# Patient Record
Sex: Female | Born: 1980 | Hispanic: No | Marital: Married
Health system: Southern US, Community
[De-identification: ages and names within clinical notes are randomized; demographics above are authoritative.]

## PROBLEM LIST (undated history)

## (undated) DIAGNOSIS — Z789 Other specified health status: Secondary | ICD-10-CM

## (undated) HISTORY — PX: NO PAST SURGERIES: SHX2092

---

## 2003-01-14 ENCOUNTER — Emergency Department (HOSPITAL_COMMUNITY): Admission: EM | Admit: 2003-01-14 | Discharge: 2003-01-14 | Payer: Self-pay | Admitting: Emergency Medicine

## 2003-06-11 ENCOUNTER — Emergency Department (HOSPITAL_COMMUNITY): Admission: EM | Admit: 2003-06-11 | Discharge: 2003-06-11 | Payer: Self-pay | Admitting: Emergency Medicine

## 2004-03-26 ENCOUNTER — Inpatient Hospital Stay (HOSPITAL_COMMUNITY): Admission: AD | Admit: 2004-03-26 | Discharge: 2004-03-28 | Payer: Self-pay | Admitting: Obstetrics & Gynecology

## 2004-04-19 ENCOUNTER — Inpatient Hospital Stay (HOSPITAL_COMMUNITY): Admission: AD | Admit: 2004-04-19 | Discharge: 2004-04-19 | Payer: Self-pay | Admitting: Obstetrics

## 2006-09-30 ENCOUNTER — Emergency Department (HOSPITAL_COMMUNITY): Admission: EM | Admit: 2006-09-30 | Discharge: 2006-09-30 | Payer: Self-pay | Admitting: *Deleted

## 2007-03-26 ENCOUNTER — Ambulatory Visit (HOSPITAL_COMMUNITY): Admission: RE | Admit: 2007-03-26 | Discharge: 2007-03-26 | Payer: Self-pay | Admitting: Obstetrics and Gynecology

## 2007-08-11 ENCOUNTER — Inpatient Hospital Stay (HOSPITAL_COMMUNITY): Admission: AD | Admit: 2007-08-11 | Discharge: 2007-08-12 | Payer: Self-pay | Admitting: Obstetrics & Gynecology

## 2007-08-11 ENCOUNTER — Ambulatory Visit: Payer: Self-pay | Admitting: Obstetrics and Gynecology

## 2010-08-28 NOTE — L&D Delivery Note (Signed)
Delivery Note At 11:58 AM a viable female was delivered via Vaginal, Spontaneous Delivery (Presentation: Right Occiput Anterior).  APGAR: 9, 9; weight 7 lb 2.3 oz (3240 g).   Placenta status: Intact, Spontaneous.  Cord: 3 vessels with the following complications: None.   Anesthesia: Epidural  Lacerations: 2nd degree;Perineal Suture Repair: 3.0 vicryl Est. Blood Loss (mL): 300  Mom to postpartum.  Baby to nursery-stable.  Melvena Vink JEHIEL 05/24/2011, 12:30 PM

## 2010-12-07 ENCOUNTER — Other Ambulatory Visit: Payer: Self-pay | Admitting: Family Medicine

## 2010-12-07 DIAGNOSIS — Z3689 Encounter for other specified antenatal screening: Secondary | ICD-10-CM

## 2010-12-07 LAB — ANTIBODY SCREEN: Antibody Screen: NEGATIVE

## 2010-12-07 LAB — ABO/RH

## 2010-12-07 LAB — HIV ANTIBODY (ROUTINE TESTING W REFLEX): HIV: NONREACTIVE

## 2010-12-07 LAB — RUBELLA ANTIBODY, IGM: Rubella: IMMUNE

## 2010-12-22 ENCOUNTER — Ambulatory Visit (HOSPITAL_COMMUNITY)
Admission: RE | Admit: 2010-12-22 | Discharge: 2010-12-22 | Disposition: A | Discharge status: Home / Self Care | Payer: Medicaid Other | Source: Ambulatory Visit | Attending: Family Medicine | Admitting: Family Medicine

## 2010-12-22 DIAGNOSIS — Z3689 Encounter for other specified antenatal screening: Secondary | ICD-10-CM

## 2010-12-22 DIAGNOSIS — Z363 Encounter for antenatal screening for malformations: Secondary | ICD-10-CM | POA: Insufficient documentation

## 2010-12-22 DIAGNOSIS — O358XX0 Maternal care for other (suspected) fetal abnormality and damage, not applicable or unspecified: Secondary | ICD-10-CM | POA: Insufficient documentation

## 2010-12-22 DIAGNOSIS — Z1389 Encounter for screening for other disorder: Secondary | ICD-10-CM | POA: Insufficient documentation

## 2010-12-27 ENCOUNTER — Ambulatory Visit (HOSPITAL_COMMUNITY)
Admission: RE | Admit: 2010-12-27 | Discharge: 2010-12-27 | Disposition: A | Discharge status: Home / Self Care | Payer: Self-pay | Source: Ambulatory Visit | Attending: Family Medicine | Admitting: Family Medicine

## 2010-12-28 ENCOUNTER — Ambulatory Visit (HOSPITAL_COMMUNITY): Payer: Self-pay

## 2010-12-29 ENCOUNTER — Other Ambulatory Visit: Payer: Self-pay | Admitting: Family Medicine

## 2010-12-29 DIAGNOSIS — Z3689 Encounter for other specified antenatal screening: Secondary | ICD-10-CM

## 2011-01-05 ENCOUNTER — Encounter (HOSPITAL_COMMUNITY): Payer: Self-pay

## 2011-01-05 ENCOUNTER — Ambulatory Visit (HOSPITAL_COMMUNITY)
Admission: RE | Admit: 2011-01-05 | Discharge: 2011-01-05 | Disposition: A | Discharge status: Home / Self Care | Payer: Medicaid Other | Source: Ambulatory Visit | Attending: Family Medicine | Admitting: Family Medicine

## 2011-01-05 DIAGNOSIS — Z3689 Encounter for other specified antenatal screening: Secondary | ICD-10-CM | POA: Insufficient documentation

## 2011-05-05 LAB — GC/CHLAMYDIA PROBE AMP, GENITAL: Chlamydia: NEGATIVE

## 2011-05-24 ENCOUNTER — Inpatient Hospital Stay (HOSPITAL_COMMUNITY): Payer: Medicaid Other | Admitting: Anesthesiology

## 2011-05-24 ENCOUNTER — Encounter (HOSPITAL_COMMUNITY): Payer: Self-pay | Admitting: *Deleted

## 2011-05-24 ENCOUNTER — Encounter (HOSPITAL_COMMUNITY): Payer: Self-pay | Admitting: Anesthesiology

## 2011-05-24 ENCOUNTER — Inpatient Hospital Stay (HOSPITAL_COMMUNITY)
Admission: AD | Admit: 2011-05-24 | Discharge: 2011-05-26 | DRG: 775 | Disposition: A | Discharge status: Home / Self Care | Payer: Medicaid Other | Source: Ambulatory Visit | Attending: Family Medicine | Admitting: Family Medicine

## 2011-05-24 DIAGNOSIS — R05 Cough: Secondary | ICD-10-CM

## 2011-05-24 HISTORY — DX: Other specified health status: Z78.9

## 2011-05-24 LAB — CBC
MCV: 80.9 fL (ref 78.0–100.0)
RBC: 4.18 MIL/uL (ref 3.87–5.11)
RDW: 14.5 % (ref 11.5–15.5)

## 2011-05-24 LAB — RPR: RPR Ser Ql: NONREACTIVE

## 2011-05-24 MED ORDER — LACTATED RINGERS IV SOLN: 500.0000 mL | INTRAVENOUS | Status: DC | PRN

## 2011-05-24 MED ORDER — OXYTOCIN 20 UNITS IN LACTATED RINGERS INFUSION - SIMPLE
125.0000 mL/h | Freq: Once | INTRAVENOUS | Status: AC
  Administered 2011-05-24: 125 mL/h via INTRAVENOUS

## 2011-05-24 MED ORDER — ZOLPIDEM TARTRATE 5 MG PO TABS: 5.0000 mg | ORAL_TABLET | Freq: Every evening | ORAL | Status: DC | PRN

## 2011-05-24 MED ORDER — EPHEDRINE 5 MG/ML INJ
10.0000 mg | INTRAVENOUS | Status: DC | PRN
  Filled 2011-05-24: qty 4

## 2011-05-24 MED ORDER — ONDANSETRON HCL 4 MG PO TABS: 4.0000 mg | ORAL_TABLET | ORAL | Status: DC | PRN

## 2011-05-24 MED ORDER — IBUPROFEN 600 MG PO TABS: 600.0000 mg | ORAL_TABLET | Freq: Four times a day (QID) | ORAL | Status: DC | PRN

## 2011-05-24 MED ORDER — ACETAMINOPHEN 325 MG PO TABS: 650.0000 mg | ORAL_TABLET | ORAL | Status: DC | PRN

## 2011-05-24 MED ORDER — SENNOSIDES-DOCUSATE SODIUM 8.6-50 MG PO TABS
2.0000 | ORAL_TABLET | Freq: Every day | ORAL | Status: DC
  Administered 2011-05-24 – 2011-05-25 (×2): 2 via ORAL

## 2011-05-24 MED ORDER — OXYCODONE-ACETAMINOPHEN 5-325 MG PO TABS: 2.0000 | ORAL_TABLET | ORAL | Status: DC | PRN

## 2011-05-24 MED ORDER — DIPHENHYDRAMINE HCL 25 MG PO CAPS: 25.0000 mg | ORAL_CAPSULE | Freq: Four times a day (QID) | ORAL | Status: DC | PRN

## 2011-05-24 MED ORDER — WITCH HAZEL-GLYCERIN EX PADS: 1.0000 "application " | MEDICATED_PAD | CUTANEOUS | Status: DC | PRN

## 2011-05-24 MED ORDER — LANOLIN HYDROUS EX OINT: TOPICAL_OINTMENT | CUTANEOUS | Status: DC | PRN

## 2011-05-24 MED ORDER — DIBUCAINE 1 % RE OINT: 1.0000 "application " | TOPICAL_OINTMENT | RECTAL | Status: DC | PRN

## 2011-05-24 MED ORDER — ONDANSETRON HCL 4 MG/2ML IJ SOLN: 4.0000 mg | Freq: Four times a day (QID) | INTRAMUSCULAR | Status: DC | PRN

## 2011-05-24 MED ORDER — LACTATED RINGERS IV SOLN: 500.0000 mL | Freq: Once | INTRAVENOUS | Status: DC

## 2011-05-24 MED ORDER — FLEET ENEMA 7-19 GM/118ML RE ENEM: 1.0000 | ENEMA | RECTAL | Status: DC | PRN

## 2011-05-24 MED ORDER — FENTANYL 2.5 MCG/ML BUPIVACAINE 1/10 % EPIDURAL INFUSION (WH - ANES)
14.0000 mL/h | INTRAMUSCULAR | Status: DC
  Administered 2011-05-24: 14 mL/h via EPIDURAL
  Filled 2011-05-24: qty 60

## 2011-05-24 MED ORDER — DIPHENHYDRAMINE HCL 50 MG/ML IJ SOLN: 12.5000 mg | INTRAMUSCULAR | Status: DC | PRN

## 2011-05-24 MED ORDER — ONDANSETRON HCL 4 MG/2ML IJ SOLN: 4.0000 mg | INTRAMUSCULAR | Status: DC | PRN

## 2011-05-24 MED ORDER — PHENYLEPHRINE 40 MCG/ML (10ML) SYRINGE FOR IV PUSH (FOR BLOOD PRESSURE SUPPORT)
80.0000 ug | PREFILLED_SYRINGE | INTRAVENOUS | Status: DC | PRN
  Filled 2011-05-24: qty 5

## 2011-05-24 MED ORDER — TETANUS-DIPHTH-ACELL PERTUSSIS 5-2.5-18.5 LF-MCG/0.5 IM SUSP
0.5000 mL | Freq: Once | INTRAMUSCULAR | Status: AC
  Administered 2011-05-25: 0.5 mL via INTRAMUSCULAR
  Filled 2011-05-24: qty 0.5

## 2011-05-24 MED ORDER — GUAIFENESIN-DM 100-10 MG/5ML PO SYRP
10.0000 mL | ORAL_SOLUTION | ORAL | Status: DC | PRN
  Administered 2011-05-24: 10 mL via ORAL
  Filled 2011-05-24: qty 10

## 2011-05-24 MED ORDER — OXYTOCIN BOLUS FROM INFUSION
500.0000 mL | Freq: Once | INTRAVENOUS | Status: DC
  Filled 2011-05-24: qty 500
  Filled 2011-05-24: qty 1000

## 2011-05-24 MED ORDER — PRENATAL PLUS 27-1 MG PO TABS
1.0000 | ORAL_TABLET | Freq: Every day | ORAL | Status: DC
  Administered 2011-05-25 – 2011-05-26 (×2): 1 via ORAL
  Filled 2011-05-24 (×2): qty 1

## 2011-05-24 MED ORDER — CITRIC ACID-SODIUM CITRATE 334-500 MG/5ML PO SOLN: 30.0000 mL | ORAL | Status: DC | PRN

## 2011-05-24 MED ORDER — PHENYLEPHRINE 40 MCG/ML (10ML) SYRINGE FOR IV PUSH (FOR BLOOD PRESSURE SUPPORT)
PREFILLED_SYRINGE | INTRAVENOUS | Status: AC
  Filled 2011-05-24: qty 5

## 2011-05-24 MED ORDER — LIDOCAINE HCL (PF) 1 % IJ SOLN
30.0000 mL | INTRAMUSCULAR | Status: DC | PRN
  Filled 2011-05-24 (×2): qty 30

## 2011-05-24 MED ORDER — BENZOCAINE-MENTHOL 20-0.5 % EX AERO: 1.0000 "application " | INHALATION_SPRAY | CUTANEOUS | Status: DC | PRN

## 2011-05-24 MED ORDER — SIMETHICONE 80 MG PO CHEW: 80.0000 mg | CHEWABLE_TABLET | ORAL | Status: DC | PRN

## 2011-05-24 MED ORDER — IBUPROFEN 600 MG PO TABS
600.0000 mg | ORAL_TABLET | Freq: Four times a day (QID) | ORAL | Status: DC
  Administered 2011-05-24 – 2011-05-26 (×8): 600 mg via ORAL
  Filled 2011-05-24 (×8): qty 1

## 2011-05-24 MED ORDER — FENTANYL 2.5 MCG/ML BUPIVACAINE 1/10 % EPIDURAL INFUSION (WH - ANES)
INTRAMUSCULAR | Status: DC | PRN
  Administered 2011-05-24: 13 mL/h via EPIDURAL

## 2011-05-24 MED ORDER — LIDOCAINE HCL 1.5 % IJ SOLN
INTRAMUSCULAR | Status: DC | PRN
  Administered 2011-05-24 (×2): 5 mL via EPIDURAL

## 2011-05-24 MED ORDER — FENTANYL 2.5 MCG/ML BUPIVACAINE 1/10 % EPIDURAL INFUSION (WH - ANES)
INTRAMUSCULAR | Status: AC
  Filled 2011-05-24: qty 60

## 2011-05-24 MED ORDER — OXYCODONE-ACETAMINOPHEN 5-325 MG PO TABS
1.0000 | ORAL_TABLET | ORAL | Status: DC | PRN
  Administered 2011-05-25: 1 via ORAL
  Filled 2011-05-24: qty 1

## 2011-05-24 MED ORDER — LACTATED RINGERS IV SOLN
INTRAVENOUS | Status: DC
  Administered 2011-05-24: 10:00:00 via INTRAVENOUS

## 2011-05-24 NOTE — Anesthesia Postprocedure Evaluation (Signed)
Anesthesia Post Note  Patient: Cheyenne Mora  Procedure(s) Performed: * No procedures listed *  Anesthesia type: Epidural  Patient location: Mother/Baby  Post pain: Pain level controlled  Post assessment: Post-op Vital signs reviewed  Last Vitals:  Filed Vitals:   05/24/11 1317  BP: 100/60  Pulse: 78  Temp:   Resp: 20    Post vital signs: Reviewed  Level of consciousness: awake  Complications: No apparent anesthesia complications

## 2011-05-24 NOTE — H&P (Signed)
Cheyenne Mora is a 30 y.o. female presenting for spontaneous onset of labor. Maternal Medical History:  Reason for admission: Reason for admission: contractions.  Reason for Admission:   nauseaContractions: Onset was 3-5 hours ago.   Frequency: regular.   Duration is approximately 1 minute.   Perceived severity is moderate.    Fetal activity: Perceived fetal activity is normal.   Last perceived fetal movement was within the past hour.    Prenatal complications: No bleeding, cholelithiasis, HIV, hypertension, infection, IUGR, pre-eclampsia or substance abuse.   Prenatal Complications - Diabetes: none.    OB History    Grav Para Term Preterm Abortions TAB SAB Ect Mult Living   3 2 2  0 0 0 0 0 0 2     Past Medical History  Diagnosis Date  . No pertinent past medical history    Past Surgical History  Procedure Date  . No past surgeries    Family History: family history is not on file. Social History:  reports that she has never smoked. She has never used smokeless tobacco. She reports that she does not drink alcohol or use illicit drugs.  Review of Systems  Constitutional: Negative.  Negative for fever, chills and weight loss.  Eyes: Negative for blurred vision and double vision.  Respiratory: Negative for cough.   Cardiovascular: Negative for chest pain, palpitations and leg swelling.  Gastrointestinal: Negative for nausea and vomiting.  Genitourinary: Negative for dysuria, hematuria and flank pain.  Neurological: Negative for dizziness and headaches.    Dilation: 4 Effacement (%): 80 Station: 0 Exam by:: J. Spurlock-Frizzell, RN Blood pressure 112/42, pulse 89, temperature 97.7 F (36.5 C), temperature source Oral, resp. rate 20, height 5\' 6"  (1.676 m), weight 89.359 kg (197 lb). Maternal Exam:  Uterine Assessment: Contraction strength is moderate.  Contraction duration is 1 minute. Contraction frequency is regular.   Abdomen: Patient reports no abdominal  tenderness. Fetal presentation: vertex  Introitus: not evaluated.   Ferning test: not done.  Nitrazine test: not done. Amniotic fluid character: not assessed.  Cervix: not evaluated.   Fetal Exam Fetal Monitor Review: Mode: ultrasound.   Baseline rate: 145.  Variability: moderate (6-25 bpm).   Pattern: early decelerations and accelerations present.    Fetal State Assessment: Category I - tracings are normal.     Physical Exam  Nursing note and vitals reviewed. Constitutional: She is oriented to person, place, and time. She appears well-developed and well-nourished. No distress.  HENT:  Head: Normocephalic and atraumatic.  Cardiovascular: Normal rate, regular rhythm, normal heart sounds and intact distal pulses.  Exam reveals no gallop and no friction rub.   No murmur heard. Respiratory: Effort normal and breath sounds normal. No respiratory distress. She has no wheezes. She has no rales.  GI: Bowel sounds are normal.       Gravid  Musculoskeletal: She exhibits no edema and no tenderness.  Neurological: She is alert and oriented to person, place, and time.  Skin: Skin is warm and dry. She is not diaphoretic.  Psychiatric: She has a normal mood and affect. Her behavior is normal. Judgment and thought content normal.    Prenatal labs: ABO, Rh: A/Positive/-- (04/11 0000) Antibody: Negative (04/11 0000) Rubella: Immune (04/11 0000) RPR: Nonreactive (04/11 0000)  HBsAg: Positive (04/11 0000)  HIV: Non-reactive (04/11 0000)  GBS: Negative (09/07 0000)   Assessment/Plan: Patient is 30 year old female G3P2002 at 42 wk 4 d GA with spontaneous onset of labor.    Janeece Riggers  05/24/2011, 8:54 AM  Patient seen in conjunction with Janeece Riggers, PA-S.  Agree with the note as written above.  Will admit to L&D and provide expectant management.    Candelaria Celeste JEHIEL 05/24/11

## 2011-05-24 NOTE — Anesthesia Procedure Notes (Signed)
Epidural Patient location during procedure: OB Start time: 05/24/2011 8:38 AM End time: 05/24/2011 8:45 AM Reason for block: procedure for pain  Staffing Anesthesiologist: Sandrea Hughs Performed by: anesthesiologist   Preanesthetic Checklist Completed: patient identified, site marked, surgical consent, pre-op evaluation, timeout performed, IV checked, risks and benefits discussed and monitors and equipment checked  Epidural Patient position: sitting Prep: site prepped and draped and DuraPrep Patient monitoring: continuous pulse ox and blood pressure Approach: midline Injection technique: LOR air  Needle:  Needle type: Tuohy  Needle gauge: 17 G Needle length: 9 cm Needle insertion depth: 6 cm Catheter type: closed end flexible Catheter size: 19 Gauge Catheter at skin depth: 11 cm Test dose: negative and 1.5% lidocaine  Assessment Events: blood not aspirated, injection not painful, no injection resistance, negative IV test and no paresthesia

## 2011-05-24 NOTE — Progress Notes (Signed)
Cheyenne Mora is a 30 y.o. G3P2002 at [redacted]w[redacted]d by LMP admitted for active labor  Subjective: Feeling quite a bit of pain with contractions.  Objective: BP 122/57  Pulse 76  Temp(Src) 98.1 F (36.7 C) (Oral)  Resp 20  Ht 5\' 6"  (1.676 m)  Wt 89.359 kg (197 lb)  BMI 31.80 kg/m2      FHT:  FHR: 140s bpm, variability: moderate,  accelerations:  Present,  decelerations:  Absent UC:   regular, every 2-3 minutes SVE:   Dilation: 5.5 Effacement (%): 80 Station: -1 Exam by:: dr. Adrian Blackwater  Labs: Lab Results  Component Value Date   WBC 4.8 05/24/2011   HGB 11.0* 05/24/2011   HCT 33.8* 05/24/2011   MCV 80.9 05/24/2011   PLT 260 05/24/2011    Assessment / Plan: AROM with clear fluid.  Fetal Wellbeing:  Category I Pain Control:  Epidural I/D:  n/a Anticipated MOD:  NSVD  STINSON, JACOB JEHIEL 05/24/2011, 11:36 AM

## 2011-05-24 NOTE — Anesthesia Preprocedure Evaluation (Signed)
Anesthesia Evaluation  Name, MR# and DOB Patient awake  General Assessment Comment  Reviewed: Allergy & Precautions, H&P , NPO status , Patient's Chart, lab work & pertinent test results  Airway Mallampati: II TM Distance: >3 FB Neck ROM: full    Dental No notable dental hx.    Pulmonary  clear to auscultation  pulmonary exam normalPulmonary Exam Normal breath sounds clear to auscultation none    Cardiovascular     Neuro/Psych Negative Neurological ROS  Negative Psych ROS  GI/Hepatic/Renal negative GI ROS  negative Liver ROS  negative Renal ROS        Endo/Other  Negative Endocrine ROS (+)      Abdominal Normal abdominal exam  (+)   Musculoskeletal negative musculoskeletal ROS (+)   Hematology negative hematology ROS (+)   Peds  Reproductive/Obstetrics (+) Pregnancy    Anesthesia Other Findings             Anesthesia Physical Anesthesia Plan  ASA: II  Anesthesia Plan: Epidural   Post-op Pain Management:    Induction:   Airway Management Planned:   Additional Equipment:   Intra-op Plan:   Post-operative Plan:   Informed Consent: I have reviewed the patients History and Physical, chart, labs and discussed the procedure including the risks, benefits and alternatives for the proposed anesthesia with the patient or authorized representative who has indicated his/her understanding and acceptance.     Plan Discussed with:   Anesthesia Plan Comments:         Anesthesia Quick Evaluation  

## 2011-05-24 NOTE — Progress Notes (Signed)
05/24/2011 Cheyenne Mora  Interpreter  Assisted  Christy  RN with questions

## 2011-05-24 NOTE — Progress Notes (Signed)
Cheyenne Mora is a 30 y.o. G3P2002 at [redacted]w[redacted]d by LMP admitted for active labor  Subjective: Has epidural - still feels contractions a little.  No pressure.  Objective: BP 109/59  Pulse 77  Temp(Src) 98.1 F (36.7 C) (Oral)  Resp 18  Ht 5\' 6"  (1.676 m)  Wt 89.359 kg (197 lb)  BMI 31.80 kg/m2      FHT:  FHR: 140s bpm, variability: moderate,  accelerations:  Present,  decelerations:  Absent UC:   regular, every 3-4 minutes SVE:   Dilation: 5 Effacement (%): 80 Station: -2 Exam by:: felkel,rn  Labs: Lab Results  Component Value Date   WBC 4.8 05/24/2011   HGB 11.0* 05/24/2011   HCT 33.8* 05/24/2011   MCV 80.9 05/24/2011   PLT 260 05/24/2011    Assessment / Plan: Spontaneous labor, progressing normally  Labor: Progressing normally Preeclampsia:  n/a Pain Control:  Epidural I/D:  n/a Anticipated MOD:  NSVD  STINSON, JACOB JEHIEL 05/24/2011, 10:45 AM

## 2011-05-24 NOTE — Progress Notes (Signed)
Cheyenne Mora in for triage. Pt states contr.ctions are every 5-6 minutes. Was 1 cm last week.

## 2011-05-25 NOTE — Progress Notes (Signed)
Post Partum Day 1 Subjective: no complaints, voiding, tolerating PO, + flatus and no BM  Objective: Blood pressure 90/60, pulse 71, temperature 98.1 F (36.7 C), temperature source Oral, resp. rate 16, height 5\' 6"  (1.676 m), weight 89.359 kg (197 lb), unknown if currently breastfeeding.  Physical Exam:  General: alert, cooperative and no distress Lochia: appropriate Uterine Fundus: firm DVT Evaluation: No evidence of DVT seen on physical exam. Negative Homan's sign. No cords or calf tenderness. No significant calf/ankle edema.   Basename 05/24/11 0730  HGB 11.0*  HCT 33.8*  CBG (last 3)  No results found for this basename: GLUCAP:3 in the last 72 hours    Assessment/Plan: Plan for discharge tomorrow and Contraception planning IUD  Interpreter was present for this visit.   LOS: 1 day   SMITH,JOSHUA 05/25/2011, 8:36 AM    Pt hx reviewed; pt seen and examined and agree with note above. [facility]

## 2011-05-25 NOTE — Progress Notes (Signed)
UR chart review completed.  

## 2011-05-25 NOTE — Anesthesia Postprocedure Evaluation (Signed)
  Anesthesia Post-op Note  Patient: Cheyenne Mora  Level of Consciousness: awake, alert  and oriented  Airway and Oxygen Therapy: Patient Spontanous Breathing  Post-op Pain: mild  Post-op Assessment: Post-op Vital signs reviewed, No headache, No backache, No residual numbness and No residual motor weakness  Post-op Vital Signs: Reviewed and stable  Complications: No apparent anesthesia complications

## 2011-05-25 NOTE — Progress Notes (Signed)
Encounter addended by: Karleen Dolphin on: 05/25/2011  9:04 AM<BR>     Documentation filed: Notes Section, Charges VN

## 2011-05-26 ENCOUNTER — Other Ambulatory Visit: Payer: Self-pay | Admitting: Obstetrics & Gynecology

## 2011-05-26 MED ORDER — DOCUSATE SODIUM 100 MG PO CAPS: 100.0000 mg | ORAL_CAPSULE | Freq: Two times a day (BID) | ORAL | Status: DC | PRN

## 2011-05-26 MED ORDER — IBUPROFEN 600 MG PO TABS: 600.0000 mg | ORAL_TABLET | Freq: Four times a day (QID) | ORAL | Status: DC

## 2011-05-26 MED ORDER — DOCUSATE SODIUM 100 MG PO CAPS: 100.0000 mg | ORAL_CAPSULE | Freq: Two times a day (BID) | ORAL | Status: AC | PRN

## 2011-05-26 MED ORDER — FERROUS SULFATE 325 (65 FE) MG PO TABS: 325.0000 mg | ORAL_TABLET | Freq: Three times a day (TID) | ORAL | Status: DC

## 2011-05-26 MED ORDER — IBUPROFEN 600 MG PO TABS: 600.0000 mg | ORAL_TABLET | Freq: Four times a day (QID) | ORAL | Status: AC

## 2011-05-26 MED ORDER — FERROUS SULFATE 325 (65 FE) MG PO TABS: 325.0000 mg | ORAL_TABLET | Freq: Three times a day (TID) | ORAL | Status: AC

## 2011-05-26 NOTE — Progress Notes (Signed)
05/26/2011 Cheyenne Mora  Interpreter  I assisted [facility] with discharge plan.

## 2011-05-26 NOTE — Discharge Summary (Signed)
Obstetric Discharge Summary Reason for Admission: onset of labor Prenatal Procedures: ultrasound Intrapartum Procedures: spontaneous vaginal delivery Postpartum Procedures: none Complications-Operative and Postpartum: 2nd degree perineal laceration Hemoglobin  Date Value Range Status  05/24/2011 11.0* 12.0-15.0 (g/dL) Final     HCT  Date Value Range Status  05/24/2011 33.8* 36.0-46.0 (%) Final    Discharge Diagnoses: Term Pregnancy-delivered  Discharge Information: Date: 05/26/2011 Activity: pelvic rest Diet: routine Medications: PNV, Ibuprofen, Colace and Iron Condition: stable Instructions: refer to practice specific booklet Discharge to: home Follow-up Information    Follow up with [facility] [place]. Call today. (for 4-6 week PP visit)    Contact information:   [address] [facility] [address]          Newborn Data: Live born female  Birth Weight: 7 lb 2.3 oz (3240 g) APGAR: 9, 9  Baby may stay secondary to cardiac murmur evaluation.  BOOTH, ERIN 05/26/2011, 7:58 AM

## 2011-05-26 NOTE — Progress Notes (Signed)
Post Partum Day 2 Subjective: no complaints, up ad lib, voiding, tolerating PO, + flatus and no BM yet.  Some anxiety over baby as baby has a heart murmur.  Br/Bo feeding.  IUD for contraception  Objective: Blood pressure 104/62, pulse 75, temperature 97.8 F (36.6 C), temperature source Oral, resp. rate 18, height 5\' 6"  (1.676 m), weight 197 lb (89.359 kg), unknown if currently breastfeeding.  Physical Exam:  General: alert, cooperative, appears stated age and no distress Lochia: appropriate Uterine Fundus: firm DVT Evaluation: No evidence of DVT seen on physical exam. No cords or calf tenderness. No significant calf/ankle edema.   Basename 05/24/11 0730  HGB 11.0*  HCT 33.8*    Assessment/Plan: Discharge home and Contraception IUD at [facility] visit Baby may be staying due to cardiac murmur.   LOS: 2 days   BOOTH, Jennavecia Schwier 05/26/2011, 7:54 AM

## 2011-06-02 LAB — CBC
HCT: 27.6 — ABNORMAL LOW
Hemoglobin: 9.2 — ABNORMAL LOW
RBC: 3.43 — ABNORMAL LOW
WBC: 7.2

## 2011-06-05 LAB — CBC
HCT: 33.5 — ABNORMAL LOW
Hemoglobin: 11.3 — ABNORMAL LOW
MCHC: 33.8
RBC: 4.2
RDW: 14.1

## 2014-06-29 ENCOUNTER — Encounter (HOSPITAL_COMMUNITY): Payer: Self-pay | Admitting: *Deleted

## 2016-06-21 ENCOUNTER — Encounter (HOSPITAL_COMMUNITY): Payer: Self-pay

## 2018-06-19 ENCOUNTER — Other Ambulatory Visit: Payer: Self-pay

## 2018-06-19 ENCOUNTER — Encounter (HOSPITAL_COMMUNITY): Payer: Self-pay | Admitting: Emergency Medicine

## 2018-06-19 ENCOUNTER — Emergency Department (HOSPITAL_COMMUNITY)
Admission: EM | Admit: 2018-06-19 | Discharge: 2018-06-19 | Disposition: A | Discharge status: Home / Self Care | Payer: Self-pay | Attending: Emergency Medicine | Admitting: Emergency Medicine

## 2018-06-19 DIAGNOSIS — Y92410 Unspecified street and highway as the place of occurrence of the external cause: Secondary | ICD-10-CM | POA: Insufficient documentation

## 2018-06-19 DIAGNOSIS — Y9389 Activity, other specified: Secondary | ICD-10-CM | POA: Insufficient documentation

## 2018-06-19 DIAGNOSIS — S161XXA Strain of muscle, fascia and tendon at neck level, initial encounter: Secondary | ICD-10-CM

## 2018-06-19 DIAGNOSIS — Y999 Unspecified external cause status: Secondary | ICD-10-CM | POA: Insufficient documentation

## 2018-06-19 MED ORDER — CYCLOBENZAPRINE HCL 10 MG PO TABS: 10.0000 mg | ORAL_TABLET | Freq: Two times a day (BID) | ORAL | 0 refills | Status: AC | PRN
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
## 2018-06-19 NOTE — Discharge Instructions (Addendum)
Please read instructions below. Apply ice to your areas of pain for 20 minutes at a time. You can take 600 mg of Advil/ibuprofen every 6 hours as needed for pain. You can take flexeril every 12 hours as needed for muscle spasm. Be aware this medication can make you drowsy. Schedule an appointment with your primary care provider to follow up on your visit today. Return to the ER for severely worsening headache, vision changes, if new numbness or tingling in your arms or legs, inability to urinate, inability to hold your bowels, or weakness in your extremities.   Por favor, lea las instrucciones a continuacin. Aplique hielo en sus reas de dolor durante 20 minutos a la vez. Puede tomar 600 mg de Advil / ibuprofeno cada 6 horas segn sea necesario para el dolor. Puede tomar flexeril cada 12 horas segn sea necesario para el espasmo muscular. Tenga en cuenta que este medicamento puede causar somnolencia. Programe una cita con su proveedor de atencin primaria para hacer un seguimiento de su visita hoy. Regrese a la sala de emergencias por un dolor de cabeza que empeora severamente, cambios en la visin, si se produce un nuevo entumecimiento u hormigueo en los brazos o las piernas, incapacidad para Geographical information systems officer, incapacidad para retener los intestinos o debilidad en las extremidades.
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
## 2018-09-25 ENCOUNTER — Other Ambulatory Visit (HOSPITAL_COMMUNITY): Payer: Self-pay | Admitting: *Deleted

## 2018-09-25 DIAGNOSIS — N644 Mastodynia: Secondary | ICD-10-CM

## 2018-10-15 ENCOUNTER — Encounter (HOSPITAL_COMMUNITY): Payer: Self-pay

## 2018-10-15 ENCOUNTER — Ambulatory Visit
Admission: RE | Admit: 2018-10-15 | Discharge: 2018-10-15 | Disposition: A | Discharge status: Home / Self Care | Payer: No Typology Code available for payment source | Source: Ambulatory Visit | Attending: Obstetrics and Gynecology | Admitting: Obstetrics and Gynecology

## 2018-10-15 ENCOUNTER — Ambulatory Visit (HOSPITAL_COMMUNITY)
Admission: RE | Admit: 2018-10-15 | Discharge: 2018-10-15 | Disposition: A | Discharge status: Home / Self Care | Payer: No Typology Code available for payment source | Source: Ambulatory Visit | Attending: Obstetrics and Gynecology | Admitting: Obstetrics and Gynecology

## 2018-10-15 VITALS — BP 118/78 | Wt 197.0 lb

## 2018-10-15 DIAGNOSIS — N644 Mastodynia: Secondary | ICD-10-CM

## 2018-10-15 DIAGNOSIS — Z1239 Encounter for other screening for malignant neoplasm of breast: Secondary | ICD-10-CM

## 2018-10-15 NOTE — Patient Instructions (Signed)
Explained breast self awareness with Jule Ser. Patient did not need a Pap smear today due to last Pap smear was 06/21/2016. Let her know BCCCP will cover Pap smears every 3 years unless has a history of abnormal Pap smears. Referred patient to the [facility] for a diagnostic mammogram. Appointment scheduled for Tuesday, October 15, 2018 at 0930. Patient aware of appointment and will be there. Norton Pastel Ramos verbalized understanding.  Lulie Hurd, Kathaleen Maser, RN 10:42 AM

## 2018-10-15 NOTE — Progress Notes (Signed)
Complaints of right outer breast pain since December 2019 that is constant. Patient rates the pain at a 3 out of 10.  Pap Smear: Pap smear not completed today. Last Pap smear was 06/21/2016 at the [facility] and normal. Per patient has no history of an abnormal Pap smear. Last Pap smear result is in Epic.  Physical exam: Breasts Breasts symmetrical. No skin abnormalities bilateral breasts. No nipple retraction bilateral breasts. No nipple discharge bilateral breasts. No lymphadenopathy. No lumps palpated bilateral breasts. Complaints of right outer breast tenderness on exam. Referred patient to the [facility] for a diagnostic mammogram. Appointment scheduled for Tuesday, October 15, 2018 at 0930.        Pelvic/Bimanual No Pap smear completed today since last Pap smear was 06/21/2016. Pap smear not indicated per BCCCP guidelines.   Smoking History: Patient has never smoked.  Patient Navigation: Patient education provided. Access to services provided for patient through [facility] program. Spanish interpreter provided.   Breast and Cervical Cancer Risk Assessment: Patient has no family history of breast cancer, known genetic mutations, or radiation treatment to the chest before age 67. Patient has no history of cervical dysplasia, immunocompromised, or DES exposure in-utero.  Risk Assessment    Risk Scores      10/15/2018   Last edited by: Lynnell Dike, LPN   5-year risk: 0.2 %   Lifetime risk: 6.5 %         Used Spanish interpreter Natale Lay from [place].

## 2018-10-16 ENCOUNTER — Encounter (HOSPITAL_COMMUNITY): Payer: Self-pay | Admitting: *Deleted

## 2018-10-17 ENCOUNTER — Encounter (HOSPITAL_COMMUNITY): Payer: Self-pay | Admitting: *Deleted

## 2020-04-05 IMAGING — MG DIGITAL DIAGNOSTIC BILATERAL MAMMOGRAM WITH TOMO AND CAD
6 of 10 series · 6 of 30 positions shown · non-contrast
Comparison: None

CLINICAL DATA: Patient presents tenderness within right breast.

EXAM:
DIGITAL DIAGNOSTIC BILATERAL MAMMOGRAM WITH CAD AND TOMO
ULTRASOUND RIGHT BREAST

[L MLO synth-2D]
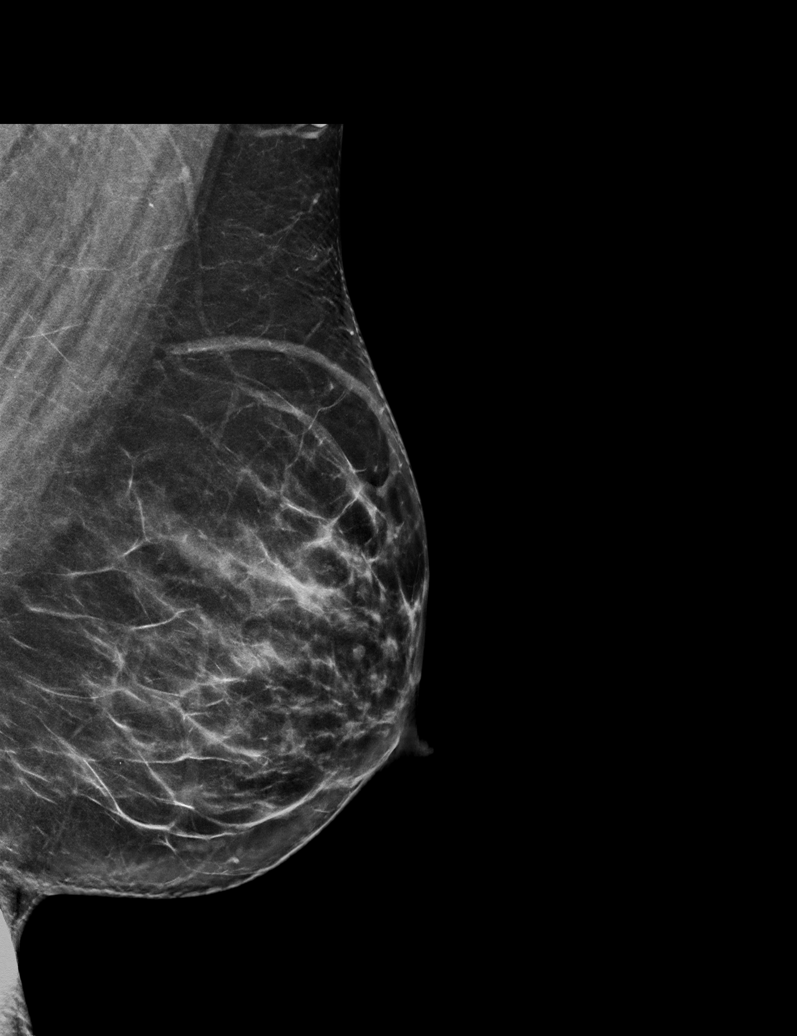

[R TAN synth-2D]
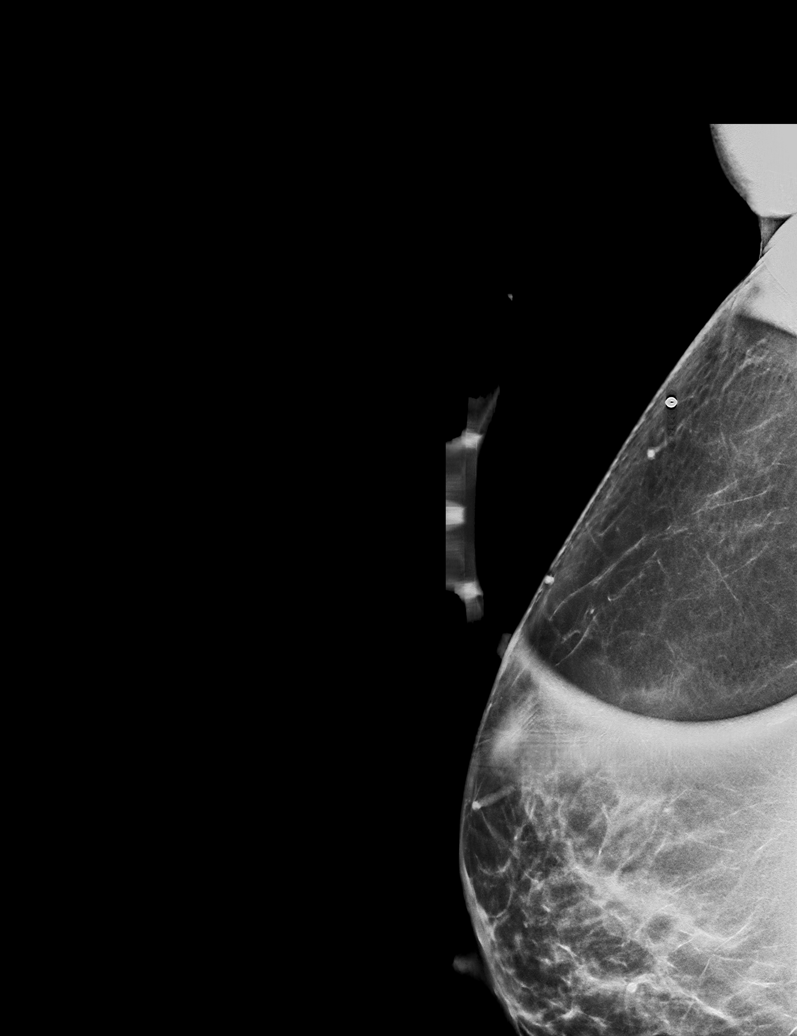

[R MLO synth-2D]
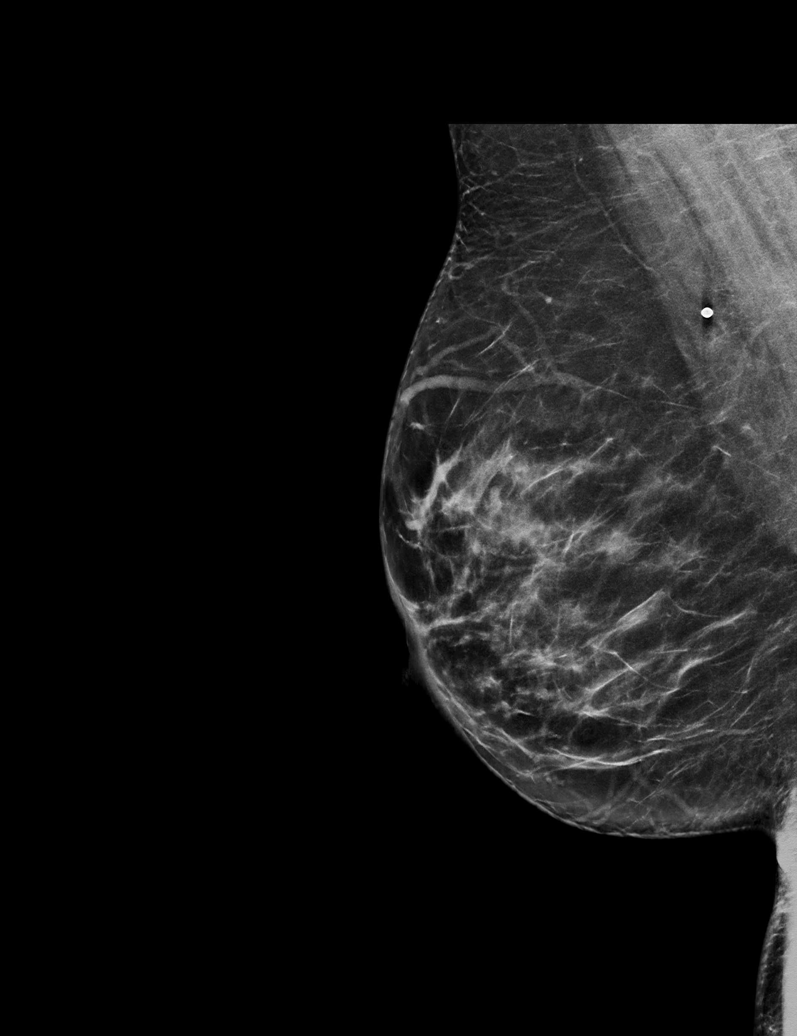

[L CC synth-2D]
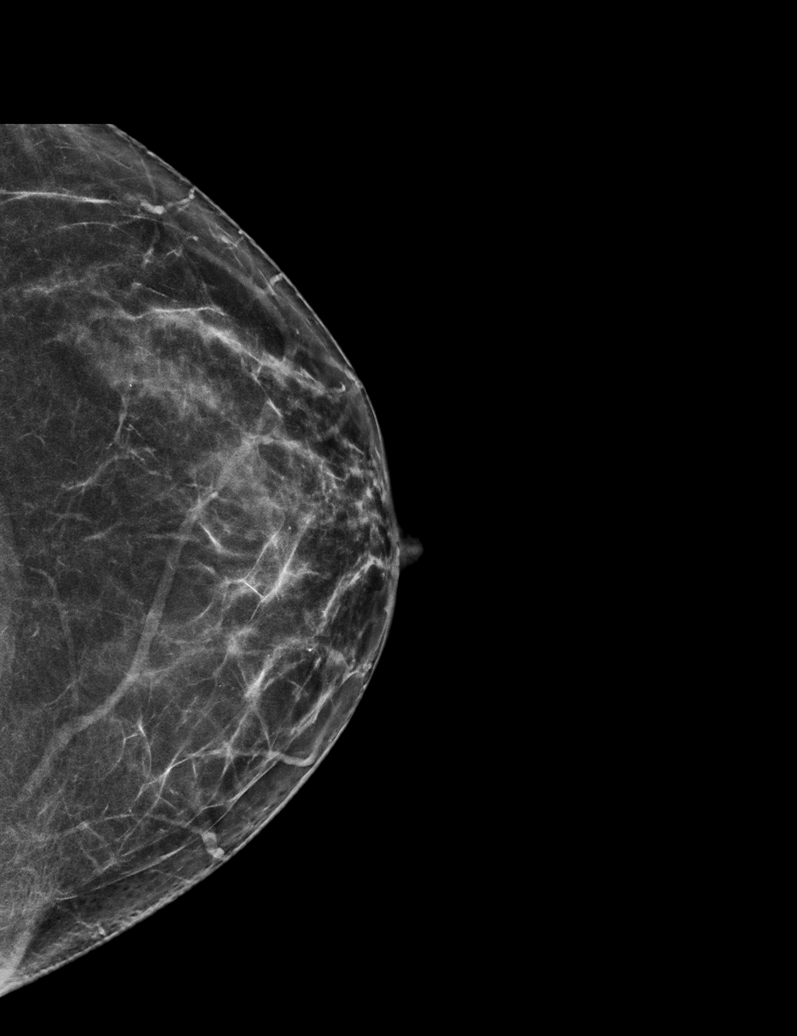

[R CC synth-2D]
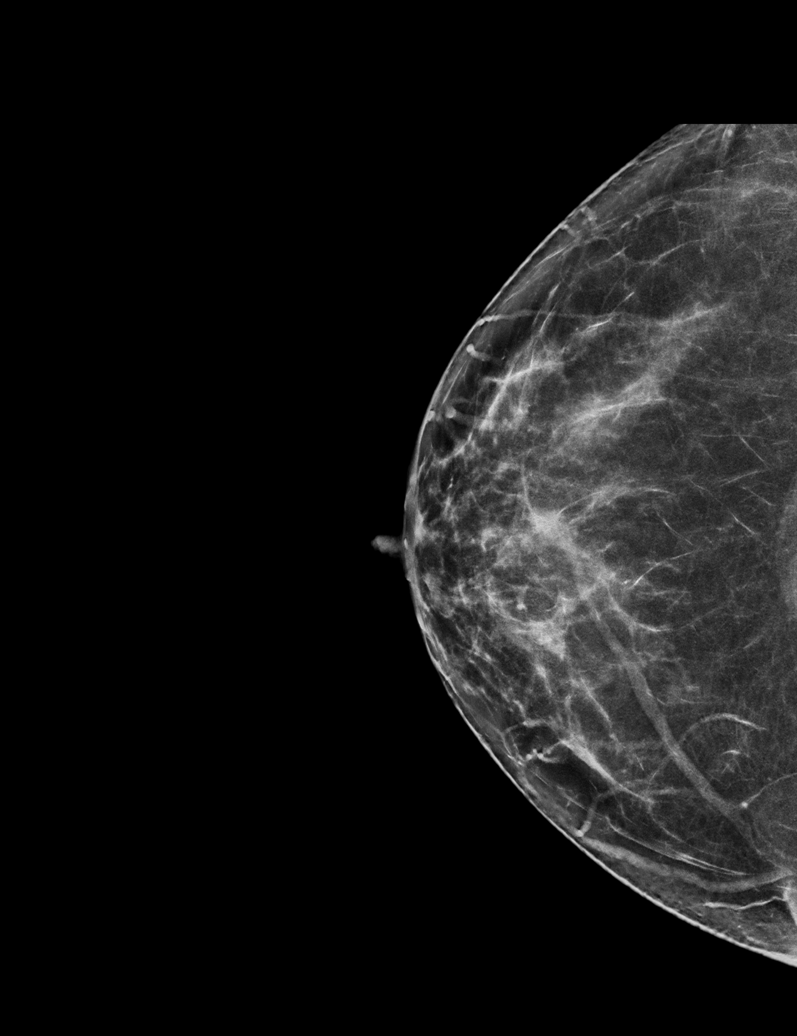

[R CC tomo · tomo slice 33/65.0]
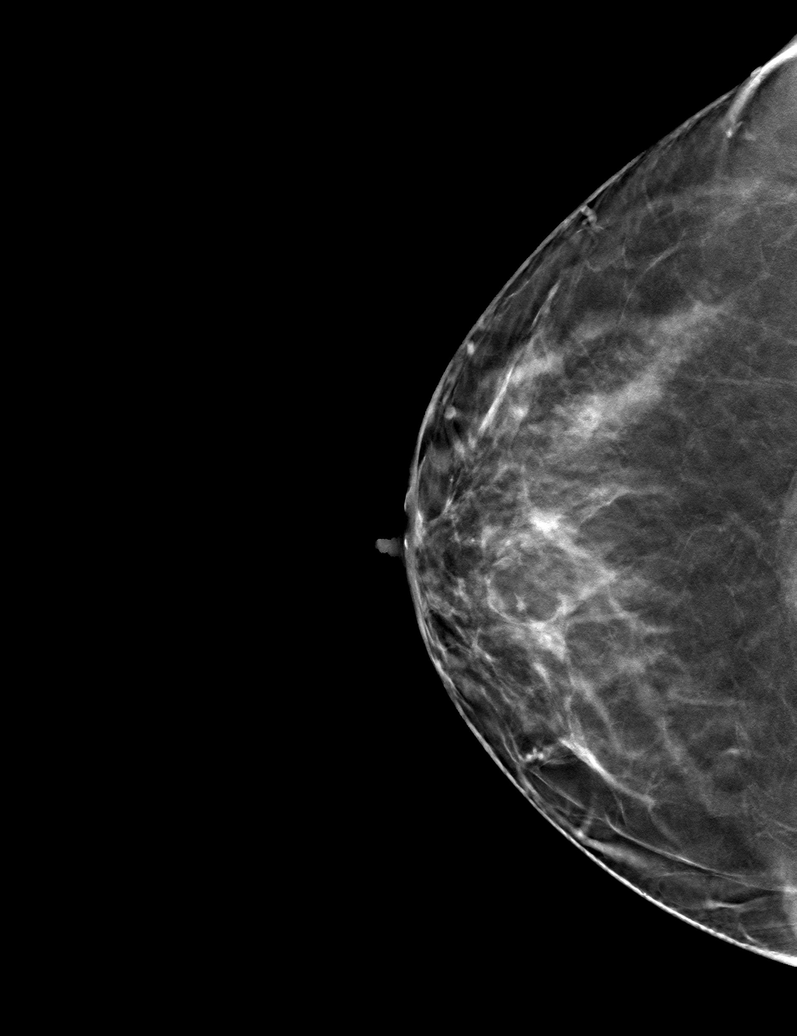

[6 of 30 positions shown; findings below may reference images not displayed]

ACR Breast Density Category c: The breast tissue is heterogeneously
dense, which may obscure small masses.
FINDINGS: No concerning masses, calcifications or distortion within either
breast.

Mammographic images were processed with CAD.

Targeted ultrasound is performed, showing normal tissue within the
right breast 9:30 o'clock 9 cm from nipple at the site of focal
tenderness.
IMPRESSION: No mammographic evidence for malignancy.

RECOMMENDATION:
Continued clinical evaluation focal tenderness lateral right breast.

Screening mammogram at age 41 unless there are persistent or
intervening clinical concerns. (Code:ES-8-BNH)

I have discussed the findings and recommendations with the patient.
Results were also provided in writing at the conclusion of the
visit. If applicable, a reminder letter will be sent to the patient
regarding the next appointment.

BI-RADS CATEGORY  1: Negative.

## 2020-04-05 IMAGING — US ULTRASOUND RIGHT BREAST LIMITED
1 series · 2 of 2 positions shown · non-contrast
Comparison: None

CLINICAL DATA: Patient presents tenderness within right breast.

EXAM:
DIGITAL DIAGNOSTIC BILATERAL MAMMOGRAM WITH CAD AND TOMO
ULTRASOUND RIGHT BREAST

[Series 1: ultrasound right breast limited · 0.06mm/px · 2 of 2 slices shown]
[im 1/2]
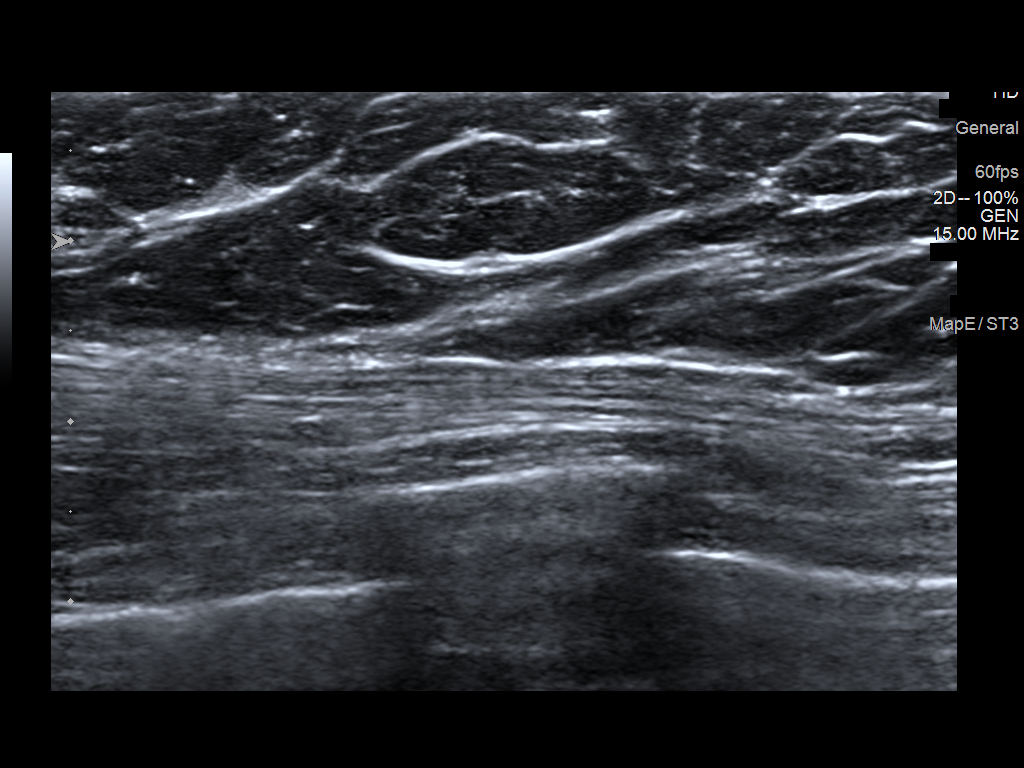
[im 2/2]
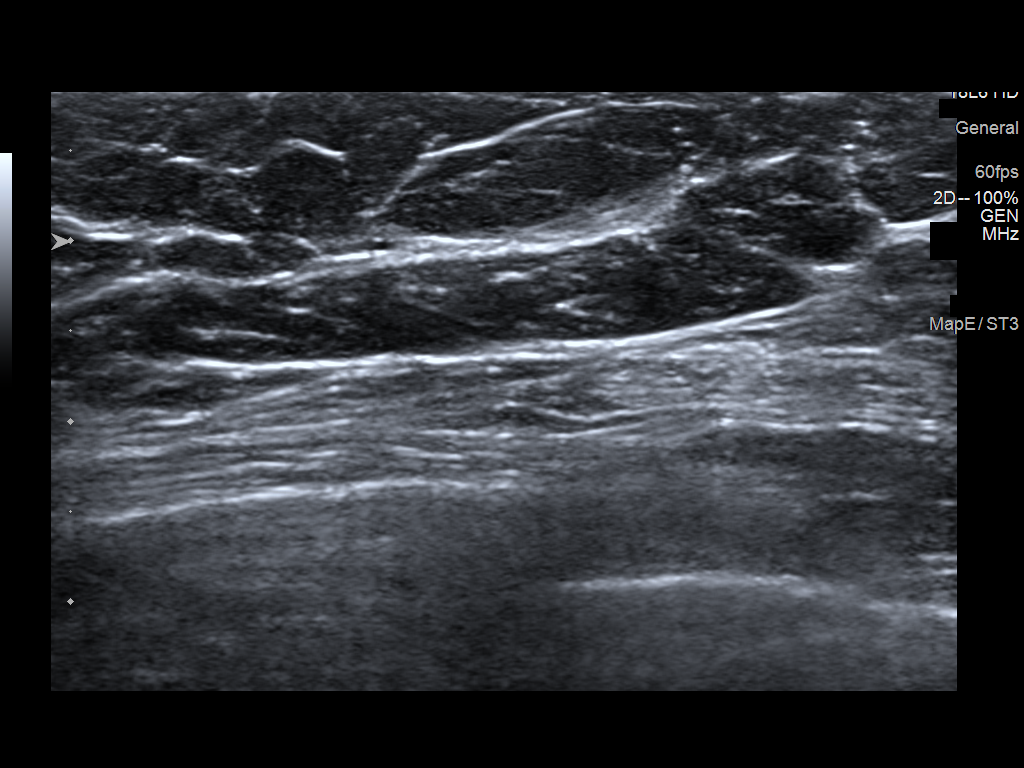

[2 of 2 positions shown; findings below may reference images not displayed]

ACR Breast Density Category c: The breast tissue is heterogeneously
dense, which may obscure small masses.
FINDINGS: No concerning masses, calcifications or distortion within either
breast.

Mammographic images were processed with CAD.

Targeted ultrasound is performed, showing normal tissue within the
right breast 9:30 o'clock 9 cm from nipple at the site of focal
tenderness.
IMPRESSION: No mammographic evidence for malignancy.

RECOMMENDATION:
Continued clinical evaluation focal tenderness lateral right breast.

Screening mammogram at age 41 unless there are persistent or
intervening clinical concerns. (Code:ES-8-BNH)

I have discussed the findings and recommendations with the patient.
Results were also provided in writing at the conclusion of the
visit. If applicable, a reminder letter will be sent to the patient
regarding the next appointment.

BI-RADS CATEGORY  1: Negative.

## 2020-11-30 ENCOUNTER — Encounter (HOSPITAL_COMMUNITY): Payer: Self-pay

## 2020-11-30 ENCOUNTER — Emergency Department (HOSPITAL_COMMUNITY)
Admission: EM | Admit: 2020-11-30 | Discharge: 2020-11-30 | Disposition: A | Discharge status: Home / Self Care | Payer: Self-pay | Attending: Emergency Medicine | Admitting: Emergency Medicine

## 2020-11-30 ENCOUNTER — Other Ambulatory Visit: Payer: Self-pay

## 2020-11-30 DIAGNOSIS — M545 Low back pain, unspecified: Secondary | ICD-10-CM | POA: Insufficient documentation

## 2020-11-30 LAB — URINALYSIS, ROUTINE W REFLEX MICROSCOPIC
Bilirubin Urine: NEGATIVE
Glucose, UA: NEGATIVE mg/dL
Hgb urine dipstick: NEGATIVE
Ketones, ur: 20 mg/dL — AB
Leukocytes,Ua: NEGATIVE
Nitrite: NEGATIVE
Protein, ur: NEGATIVE mg/dL
Specific Gravity, Urine: 1.014 (ref 1.005–1.030)
pH: 6 (ref 5.0–8.0)

## 2020-11-30 LAB — POC URINE PREG, ED: Preg Test, Ur: NEGATIVE

## 2020-11-30 MED ORDER — LIDOCAINE 5 % EX PTCH
1.0000 | MEDICATED_PATCH | CUTANEOUS | Status: DC
  Administered 2020-11-30: 1 via TRANSDERMAL
  Filled 2020-11-30: qty 1

## 2020-11-30 MED ORDER — NAPROXEN 500 MG PO TABS: 500.0000 mg | ORAL_TABLET | Freq: Two times a day (BID) | ORAL | 0 refills | Status: DC

## 2020-11-30 MED ORDER — LIDOCAINE 5 % EX PTCH: 1.0000 | MEDICATED_PATCH | CUTANEOUS | 0 refills | Status: DC

## 2020-11-30 MED ORDER — METHOCARBAMOL 500 MG PO TABS
1000.0000 mg | ORAL_TABLET | Freq: Once | ORAL | Status: AC
  Administered 2020-11-30: 1000 mg via ORAL
  Filled 2020-11-30: qty 2

## 2020-11-30 MED ORDER — METHOCARBAMOL 500 MG PO TABS: 500.0000 mg | ORAL_TABLET | Freq: Every evening | ORAL | 0 refills | Status: DC | PRN

## 2020-11-30 MED ORDER — KETOROLAC TROMETHAMINE 15 MG/ML IJ SOLN
15.0000 mg | Freq: Once | INTRAMUSCULAR | Status: AC
  Administered 2020-11-30: 15 mg via INTRAMUSCULAR
  Filled 2020-11-30: qty 1

## 2020-11-30 NOTE — ED Triage Notes (Signed)
Pt c/o left sided back pain/sciatica pain x 1 week, able to ambulate. Denies injury/trauma, denies loss of control of bladder/bowel movement.

## 2020-11-30 NOTE — ED Notes (Signed)
Urine cup provided pt will give urine sample when able.

## 2020-11-30 NOTE — Discharge Instructions (Signed)
Toma naproxen 2 veces cada dia por dolor.  Toma robaxin para relajar los musculos. Ten cuidado, puede causar cansado.  [place] el patch por dolor. Da las estiras para ayudar con dolor.  Da [place] cita con el doctor ortopedico si el dolor no mejora Regresa a la sala de emergencia si tiene fiebre, dolor de [place], si no puede sentir la pierna, o con nuevos sintomas.

## 2020-11-30 NOTE — ED Provider Notes (Signed)
[facility]-EMERGENCY DEPT Provider Note   CSN: [id number] Arrival date & time: 11/30/20  3007     History Chief Complaint  Patient presents with  . Back Pain    Cheyenne Mora is a 40 y.o. female presenting for evaluation of back pain.  Patient states for the past week she has had persistent left low back pain.  It does not move over to the right side.  It does not radiate into her leg.  She denies fall, trauma, or injury.  No precipitating event.  She reports pain is worse when she stands for long time or when she is laying flat.  Pain is not worsened with movement.  She denies fevers, chills, nausea, vomiting abdominal pain, urinary symptoms, normal bowel movements.  No history of back pain previously.  She has no medical problems, takes medications daily.  She has taken Tylenol and ibuprofen for her pain with minimal short-lived relief.  She denies history of cancer or IVDU.  No new numbness or tingling.  HPI     Past Medical History:  Diagnosis Date  . No pertinent past medical history     There are no problems to display for this patient.   Past Surgical History:  Procedure Laterality Date  . NO PAST SURGERIES       OB History    Gravida  6   Para  3   Term  3   Preterm  0   AB  0   Living  1     SAB  0   IAB  0   Ectopic  0   Multiple      Live Births  1           Family History  Problem Relation Age of Onset  . Breast cancer Neg Hx     Social History   Tobacco Use  . Smoking status: Never Smoker  . Smokeless tobacco: Never Used  Vaping Use  . Vaping Use: Never used  Substance Use Topics  . Alcohol use: Not Currently  . Drug use: Never    Home Medications Prior to Admission medications   Medication Sig Start Date End Date Taking? Authorizing Provider  lidocaine (LIDODERM) 5 % Place 1 patch onto the skin daily. Remove & Discard patch within 12 hours or as directed by MD 11/30/20  Yes Eithan Beagle,  PA-C  methocarbamol (ROBAXIN) 500 MG tablet Take 1 tablet (500 mg total) by mouth at bedtime as needed for muscle spasms. 11/30/20  Yes Walter Min, PA-C  naproxen (NAPROSYN) 500 MG tablet Take 1 tablet (500 mg total) by mouth 2 (two) times daily with a meal. 11/30/20  Yes Maydelin Deming, PA-C  cyclobenzaprine (FLEXERIL) 10 MG tablet Take 1 tablet (10 mg total) by mouth 2 (two) times daily as needed for muscle spasms. Patient not taking: Reported on 10/15/2018 06/19/18   Robinson, [place] N, PA-C  ferrous sulfate (FERROUSUL) 325 (65 FE) MG tablet Take 1 tablet (325 mg total) by mouth 3 (three) times daily with meals. 05/26/11 05/25/12  Lesly Dukes, MD  prenatal vitamin w/FE, FA (PRENATAL 1 + 1) 27-1 MG TABS Take 1 tablet by mouth daily.      [provider]    Allergies    Patient has no known allergies.  Review of Systems   Review of Systems  Musculoskeletal: Positive for back pain.  Neurological: Negative for numbness.    Physical Exam Updated Vital Signs BP 132/80 (  BP Location: Right Arm)   Pulse 97   Temp 97.7 F (36.5 C) (Oral)   Resp 16   Ht 5\' 3"  (1.6 m)   Wt 97.5 kg   LMP 09/06/2020   SpO2 100%   BMI 38.09 kg/m   Physical Exam Vitals and nursing note reviewed.  Constitutional:      General: She is not in acute distress.    Appearance: She is well-developed.     Comments: Standing in no acute distress  HENT:     Head: Normocephalic and atraumatic.  Cardiovascular:     Rate and Rhythm: Normal rate and regular rhythm.     Pulses: Normal pulses.  Pulmonary:     Effort: Pulmonary effort is normal.     Breath sounds: Normal breath sounds.  Abdominal:     General: There is no distension.  Musculoskeletal:     Cervical back: Normal range of motion.     Comments: Patient is palpation of midline back.  No tenderness with palpation of the left low back musculature.  When patient lying flat, difficulty lifting leg off the bed due to pain in the low back.   With passive range of motion, patient has minimal pain.  Patellar reflexes equal bilaterally.  Good distal sensation.  No saddle anesthesia.  Patient with antalgic gait.  Skin:    General: Skin is warm.     Capillary Refill: Capillary refill takes less than 2 seconds.     Findings: No rash.  Neurological:     Mental Status: She is alert and oriented to person, place, and time.     ED Results / Procedures / Treatments   Labs (all labs ordered are listed, but only abnormal results are displayed) Labs Reviewed  URINALYSIS, ROUTINE W REFLEX MICROSCOPIC - Abnormal; Notable for the following components:      Result Value   Ketones, ur 20 (*)    All other components within normal limits  POC URINE PREG, ED    EKG None  Radiology No results found.  Procedures Procedures   Medications Ordered in ED Medications  lidocaine (LIDODERM) 5 % 1 patch (1 patch Transdermal Patch Applied 11/30/20 0827)  ketorolac (TORADOL) 15 MG/ML injection 15 mg (15 mg Intramuscular Given 11/30/20 0826)  methocarbamol (ROBAXIN) tablet 1,000 mg (1,000 mg Oral Given 11/30/20 0827)    ED Course  I have reviewed the triage vital signs and the nursing notes.  Pertinent labs & imaging results that were available during my care of the patient were reviewed by me and considered in my medical decision making (see chart for details).    MDM Rules/Calculators/A&P                          Patient presented for evaluation of left low back pain.  On exam, patient appears nontoxic.  She is neurovascularly intact.  She does not have pain with palpation, however she does have pain with movement of the leg.  No fall, trauma, or injury.  Doubt fracture dislocation.  Likely MSK pain.  Pain does not radiate into the leg, doubt sciatica.  Patient has not had a period in several months, but she is on depo, will obtain urine pregnancy test.  Will treat symptomatically.  Pregnancy and urine negative for acute findings.  On  reassessment after treatment, patient report significant improvement of pain.  She is now able to walk without a limp and lift her left leg up  in bed without significant difficulty.  She reports she still has mild pain, but is much improved.  I have low suspicion for fracture, dislocation, infection, or spinal cord compression.  I do not believe she needs further emergent imaging in the ED.  We will have her continue to treat symptomatically at home and follow-up with Ortho as needed.  At this time, patient appears safe for discharge.  Return precautions given.  Patient states he understands and agrees to plan.  Final Clinical Impression(s) / ED Diagnoses Final diagnoses:  Acute left-sided low back pain without sciatica    Rx / DC Orders ED Discharge Orders         Ordered    lidocaine (LIDODERM) 5 %  Every 24 hours        11/30/20 0937    naproxen (NAPROSYN) 500 MG tablet  2 times daily with meals        11/30/20 0937    methocarbamol (ROBAXIN) 500 MG tablet  At bedtime PRN        11/30/20 0937           Alveria Apley, PA-C 11/30/20 1039    Linwood Dibbles, MD 12/01/20 [phone]

## 2022-07-16 ENCOUNTER — Other Ambulatory Visit: Payer: Self-pay

## 2022-07-16 ENCOUNTER — Emergency Department (HOSPITAL_COMMUNITY)
Admission: EM | Admit: 2022-07-16 | Discharge: 2022-07-16 | Disposition: A | Discharge status: Home / Self Care | Payer: Self-pay | Attending: Emergency Medicine | Admitting: Emergency Medicine

## 2022-07-16 ENCOUNTER — Encounter (HOSPITAL_COMMUNITY): Payer: Self-pay

## 2022-07-16 DIAGNOSIS — M436 Torticollis: Secondary | ICD-10-CM | POA: Insufficient documentation

## 2022-07-16 MED ORDER — METHOCARBAMOL 500 MG PO TABS: 500.0000 mg | ORAL_TABLET | Freq: Two times a day (BID) | ORAL | 0 refills | Status: AC

## 2022-07-16 MED ORDER — METHOCARBAMOL 500 MG PO TABS
1000.0000 mg | ORAL_TABLET | Freq: Once | ORAL | Status: AC
  Administered 2022-07-16: 1000 mg via ORAL
  Filled 2022-07-16: qty 2

## 2022-07-16 MED ORDER — NAPROXEN 500 MG PO TABS: 500.0000 mg | ORAL_TABLET | Freq: Two times a day (BID) | ORAL | 0 refills | Status: AC

## 2022-07-16 MED ORDER — NAPROXEN 250 MG PO TABS
500.0000 mg | ORAL_TABLET | Freq: Once | ORAL | Status: AC
  Administered 2022-07-16: 500 mg via ORAL
  Filled 2022-07-16: qty 2

## 2022-07-16 MED ORDER — LIDOCAINE 5 % EX PTCH: 1.0000 | MEDICATED_PATCH | CUTANEOUS | 0 refills | Status: AC

## 2022-07-16 NOTE — ED Triage Notes (Signed)
Pt arrived POV from home c/o neck pain x several days. Pt states there is no pain on the outside when you touch her neck but it hurts to move and turn from side to side.
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
## 2022-07-16 NOTE — Discharge Instructions (Addendum)
Read the information about neck pain attached to these discharge papers. I have sent the following medications to your pharmacy:  Naproxen.  This may be used for pain and inflammation Acetaminophen.  This may be alternated with ibuprofen for pain Methocarbamol.  This is a muscle relaxant.  Only use as needed and remember it may make you drowsy so do not drive on this medication.  Additionally do not drink alcohol with this Lidocaine patches.  As we discussed these should only be worn for around 12 hours at a time.  You must have 12 hours patch free  Additionally there are multiple offices here for you to establish care for a PCP.  It was a pleasure to meet you guys and I hope Cheyenne Mora feels better

## 2022-07-16 NOTE — ED Provider Triage Note (Signed)
Emergency Medicine Provider Triage Evaluation Note  Cheyenne Mora , a 41 y.o. female  was evaluated in triage.  Pt complains of lingering right-sided neck pain following a mechanical fall 1 month ago.  Denies hitting head or losing consciousness.  Notes the pain feels deep and on the left part of the head radiating down towards the top of the shoulder.  Denies numbness or tingling to the upper extremities.  Still able to move head around quite well, though states with some rotational movements feels occasional pains.  Review of Systems  Positive:  Negative: See above  Physical Exam  BP 111/77 (BP Location: Left Arm)   Pulse 90   Temp 98.2 F (36.8 C) (Oral)   Resp 16   SpO2 97%  Gen:   Awake, no distress   Resp:  Normal effort  MSK:   Moves extremities without difficulty  Other:  No midline C-spine tenderness.  Neck very supple on exam.  No meningismus or torticollis.  Subjective deep left muscular tenderness with left lateral ROM of C-spine.  Medical Decision Making  Medically screening exam initiated at 2:43 PM.  Appropriate orders placed.  Cheyenne Mora was informed that the remainder of the evaluation will be completed by another provider, this initial triage assessment does not replace that evaluation, and the importance of remaining in the ED until their evaluation is complete.     Cecil Cobbs, PA-C 07/16/22 1449

## 2024-09-30 ENCOUNTER — Other Ambulatory Visit: Payer: Self-pay

## 2024-09-30 DIAGNOSIS — Z1231 Encounter for screening mammogram for malignant neoplasm of breast: Secondary | ICD-10-CM
